# Patient Record
Sex: Female | Born: 1985 | Race: Black or African American | Hispanic: No | Marital: Single | Smoking: Never smoker
Health system: Southern US, Community
[De-identification: ages and names within clinical notes are randomized; demographics above are authoritative.]

## PROBLEM LIST (undated history)

## (undated) DIAGNOSIS — N83209 Unspecified ovarian cyst, unspecified side: Secondary | ICD-10-CM

---

## 2014-10-31 ENCOUNTER — Emergency Department (HOSPITAL_COMMUNITY)
Admission: EM | Admit: 2014-10-31 | Discharge: 2014-11-01 | Disposition: A | Discharge status: Home / Self Care | Payer: BLUE CROSS/BLUE SHIELD | Attending: Emergency Medicine | Admitting: Emergency Medicine

## 2014-10-31 ENCOUNTER — Encounter (HOSPITAL_COMMUNITY): Payer: Self-pay | Admitting: Emergency Medicine

## 2014-10-31 DIAGNOSIS — Z3202 Encounter for pregnancy test, result negative: Secondary | ICD-10-CM | POA: Insufficient documentation

## 2014-10-31 DIAGNOSIS — Z7982 Long term (current) use of aspirin: Secondary | ICD-10-CM | POA: Insufficient documentation

## 2014-10-31 DIAGNOSIS — R1032 Left lower quadrant pain: Secondary | ICD-10-CM | POA: Diagnosis not present

## 2014-10-31 DIAGNOSIS — Z8742 Personal history of other diseases of the female genital tract: Secondary | ICD-10-CM | POA: Insufficient documentation

## 2014-10-31 DIAGNOSIS — R1030 Lower abdominal pain, unspecified: Secondary | ICD-10-CM | POA: Diagnosis present

## 2014-10-31 HISTORY — DX: Unspecified ovarian cyst, unspecified side: N83.209

## 2014-10-31 LAB — COMPREHENSIVE METABOLIC PANEL
ALK PHOS: 69 U/L (ref 38–126)
ALT: 36 U/L (ref 14–54)
AST: 29 U/L (ref 15–41)
Albumin: 3.8 g/dL (ref 3.5–5.0)
Anion gap: 9 (ref 5–15)
BUN: 6 mg/dL (ref 6–20)
CALCIUM: 8.9 mg/dL (ref 8.9–10.3)
CHLORIDE: 105 mmol/L (ref 101–111)
CO2: 24 mmol/L (ref 22–32)
CREATININE: 0.87 mg/dL (ref 0.44–1.00)
GFR calc Af Amer: >60 mL/min (ref >60)
GFR calc non Af Amer: >60 mL/min (ref >60)
GLUCOSE: 93 mg/dL (ref 65–99)
Potassium: 3.8 mmol/L (ref 3.5–5.1)
SODIUM: 138 mmol/L (ref 135–145)
Total Bilirubin: 0.5 mg/dL (ref 0.3–1.2)
Total Protein: 7.4 g/dL (ref 6.5–8.1)

## 2014-10-31 LAB — CBC
HCT: 39.4 % (ref 36.0–46.0)
Hemoglobin: 13.7 g/dL (ref 12.0–15.0)
MCH: 30.6 pg (ref 26.0–34.0)
MCHC: 34.8 g/dL (ref 30.0–36.0)
MCV: 88.1 fL (ref 78.0–100.0)
PLATELETS: 324 10*3/uL (ref 150–400)
RBC: 4.47 MIL/uL (ref 3.87–5.11)
RDW: 13 % (ref 11.5–15.5)
WBC: 13.8 10*3/uL — ABNORMAL HIGH (ref 4.0–10.5)

## 2014-10-31 LAB — URINALYSIS, ROUTINE W REFLEX MICROSCOPIC
Bilirubin Urine: NEGATIVE
GLUCOSE, UA: NEGATIVE mg/dL
HGB URINE DIPSTICK: NEGATIVE
KETONES UR: NEGATIVE mg/dL
LEUKOCYTES UA: NEGATIVE
Nitrite: NEGATIVE
PH: 7 (ref 5.0–8.0)
Protein, ur: NEGATIVE mg/dL
Specific Gravity, Urine: 1.003 — ABNORMAL LOW (ref 1.005–1.030)
Urobilinogen, UA: 0.2 mg/dL (ref 0.0–1.0)

## 2014-10-31 LAB — LIPASE, BLOOD: LIPASE: 24 U/L (ref 22–51)

## 2014-10-31 LAB — POC URINE PREG, ED: Preg Test, Ur: NEGATIVE

## 2014-10-31 MED ORDER — SODIUM CHLORIDE 0.9 % IV BOLUS (SEPSIS)
1000.0000 mL | Freq: Once | INTRAVENOUS | Status: AC
  Administered 2014-11-01: 1000 mL via INTRAVENOUS

## 2014-10-31 MED ORDER — IOHEXOL 300 MG/ML  SOLN
25.0000 mL | Freq: Once | INTRAMUSCULAR | Status: AC | PRN
  Administered 2014-11-01: 25 mL via ORAL

## 2014-10-31 MED ORDER — METOCLOPRAMIDE HCL 5 MG/ML IJ SOLN
10.0000 mg | Freq: Once | INTRAMUSCULAR | Status: AC
  Administered 2014-11-01: 10 mg via INTRAVENOUS
  Filled 2014-10-31: qty 2

## 2014-10-31 MED ORDER — LIDOCAINE HCL 2 % EX GEL
1.0000 "application " | Freq: Once | CUTANEOUS | Status: AC
  Administered 2014-11-01: 1
  Filled 2014-10-31: qty 20

## 2014-10-31 MED ORDER — DIPHENHYDRAMINE HCL 50 MG/ML IJ SOLN
25.0000 mg | Freq: Once | INTRAMUSCULAR | Status: AC
  Administered 2014-11-01: 25 mg via INTRAVENOUS
  Filled 2014-10-31: qty 1

## 2014-10-31 NOTE — ED Notes (Signed)
Pt. reports generalized abdominal pain with nausea onset today , constipation for 2 weeks , last BM today with small/hard stools , denies emesis or diarrhea. No fever or chills.

## 2014-11-01 ENCOUNTER — Emergency Department (HOSPITAL_COMMUNITY): Payer: BLUE CROSS/BLUE SHIELD

## 2014-11-01 ENCOUNTER — Encounter (HOSPITAL_COMMUNITY): Payer: Self-pay | Admitting: Radiology

## 2014-11-01 MED ORDER — IOHEXOL 300 MG/ML  SOLN
100.0000 mL | Freq: Once | INTRAMUSCULAR | Status: AC | PRN
  Administered 2014-11-01: 100 mL via INTRAVENOUS

## 2014-11-01 NOTE — ED Provider Notes (Signed)
CSN: [id number]     Arrival date & time 10/31/14  2135 History   First MD Initiated Contact with Patient 10/31/14 2331     Chief Complaint  Patient presents with  . Abdominal Pain     The history is provided by the patient. No language interpreter was used.   Brittney Blake presents for evaluation of lower abdominal pain and constipation. She reports 2 weeks of constipation with decreased stool output and straining for bowel movements. She has pain and bleeding when she comes to have a bowel movement now. She tried multiple medications at home for constipation including laxatives, stool softeners, enemas. She denies any fevers but she does have diffuse lower abdominal pain that is greatest over the left lower quadrant. The pains been there for a few days and she has associated nausea. No vomiting, dysuria, vaginal discharge. She has a history of ovarian cyst and IUD placement.  No prior abdominal surgeries.  Past Medical History  Diagnosis Date  . Ovarian cyst    History reviewed. No pertinent past surgical history. No family history on file. Social History  Substance Use Topics  . Smoking status: Never Smoker   . Smokeless tobacco: None  . Alcohol Use: Yes   OB History    No data available     Review of Systems  All other systems reviewed and are negative.      Allergies  Review of patient's allergies indicates no known allergies.  Home Medications   Prior to Admission medications   Medication Sig Start Date End Date Taking? Authorizing Provider  aspirin-acetaminophen-caffeine (EXCEDRIN MIGRAINE) [phone] MG tablet Take 1 tablet by mouth every 6 (six) hours as needed for migraine.   Yes Historical Provider, MD   BP 106/53 mmHg  Pulse 77  Temp(Src) 98.1 F (36.7 C) (Oral)  Resp 20  SpO2 100%  LMP 10/22/2014 Physical Exam  Constitutional: She is oriented to person, place, and time. She appears well-developed and well-nourished.  HENT:  Head: Normocephalic and  atraumatic.  Cardiovascular: Normal rate and regular rhythm.   No murmur heard. Pulmonary/Chest: Effort normal and breath sounds normal. No respiratory distress.  Abdominal: Soft. There is no rebound and no guarding.  Mild left lower quadrant tenderness  Genitourinary:  No external hemorrhoids. The rectal tenderness on exam with no gross blood. No mass.  Musculoskeletal: She exhibits no edema or tenderness.  Neurological: She is alert and oriented to person, place, and time.  Skin: Skin is warm and dry.  Psychiatric: She has a normal mood and affect. Her behavior is normal.  Nursing note and vitals reviewed.   ED Course  Procedures (including critical care time) Labs Review Labs Reviewed  CBC - Abnormal; Notable for the following:    WBC 13.8 (*)    All other components within normal limits  URINALYSIS, ROUTINE W REFLEX MICROSCOPIC (NOT AT [facility]) - Abnormal; Notable for the following:    Specific Gravity, Urine 1.003 (*)    All other components within normal limits  LIPASE, BLOOD  COMPREHENSIVE METABOLIC PANEL  POC URINE PREG, ED    Imaging Review Ct Abdomen Pelvis W Contrast  11/01/2014   CLINICAL DATA:  Left lower quadrant pain with nausea.  Constipation.  EXAM: CT ABDOMEN AND PELVIS WITH CONTRAST  TECHNIQUE: Multidetector CT imaging of the abdomen and pelvis was performed using the standard protocol following bolus administration of intravenous contrast.  CONTRAST:  OMNIPAQUE IOHEXOL 300 MG/ML  SOLN  COMPARISON:  None.  FINDINGS: Lower  chest and abdominal wall:  No contributory findings.  Hepatobiliary: Amorphous hypervascular areas in the right liver, most discrete in segment 5-6 measuring 7 mm, favors incidental perfusion anomalies. In this patient with no chronic liver disease or malignancy history, benign hemangioma, FNH, or adenoma are the main differential considerations.No evidence of biliary obstruction or stone.  Pancreas: Unremarkable.  Spleen: Unremarkable.   Adrenals/Urinary Tract: Negative adrenals. No hydronephrosis or stone. Unremarkable bladder.  Reproductive:Negative uterus. Fairly symmetric ovarian volume. IUD in good position.  Stomach/Bowel: Distal colonic fluid levels which may reflect diarrhea. There is no inflammatory bowel wall thickening or obstruction. No appendicitis.  Vascular/Lymphatic: No acute vascular abnormality. No mass or adenopathy.  Peritoneal: No ascites or pneumoperitoneum.  Musculoskeletal: No acute abnormalities.  IMPRESSION: No acute finding.   Electronically Signed   By: Marnee Spring M.D.   On: 11/01/2014 01:10   I have personally reviewed and evaluated these images and lab results as part of my medical decision-making.   EKG Interpretation None      MDM   Final diagnoses:  Left lower quadrant pain    Patient here for evaluation of abdominal pain and constipation. No evidence of fecal impaction on examination. Patient did have abdominal tenderness in the left lower quadrant, the leukocytosis. CT negative for acute abnormality. There is no significant constipation on CT. Discussed with patient homecare for abdominal wall pain, fluid hydration, outpatient follow-up. Return precautions were discussed.  Brittney Fossa, MD 11/01/14 [phone]

## 2014-11-01 NOTE — Discharge Instructions (Signed)
Abdominal Pain, Women °Abdominal (stomach, pelvic, or belly) pain can be caused by many things. It is important to tell your doctor: °· The location of the pain. °· Does it come and go or is it present all the time? °· Are there things that start the pain (eating certain foods, exercise)? °· Are there other symptoms associated with the pain (fever, nausea, vomiting, diarrhea)? °All of this is helpful to know when trying to find the cause of the pain. °CAUSES  °· Stomach: virus or bacteria infection, or ulcer. °· Intestine: appendicitis (inflamed appendix), regional ileitis (Crohn's disease), ulcerative colitis (inflamed colon), irritable bowel syndrome, diverticulitis (inflamed diverticulum of the colon), or cancer of the stomach or intestine. °· Gallbladder disease or stones in the gallbladder. °· Kidney disease, kidney stones, or infection. °· Pancreas infection or cancer. °· Fibromyalgia (pain disorder). °· Diseases of the female organs: °¨ Uterus: fibroid (non-cancerous) tumors or infection. °¨ Fallopian tubes: infection or tubal pregnancy. °¨ Ovary: cysts or tumors. °¨ Pelvic adhesions (scar tissue). °¨ Endometriosis (uterus lining tissue growing in the pelvis and on the pelvic organs). °¨ Pelvic congestion syndrome (female organs filling up with blood just before the menstrual period). °¨ Pain with the menstrual period. °¨ Pain with ovulation (producing an egg). °¨ Pain with an IUD (intrauterine device, birth control) in the uterus. °¨ Cancer of the female organs. °· Functional pain (pain not caused by a disease, may improve without treatment). °· Psychological pain. °· Depression. °DIAGNOSIS  °Your doctor will decide the seriousness of your pain by doing an examination. °· Blood tests. °· X-rays. °· Ultrasound. °· CT scan (computed tomography, special type of X-ray). °· MRI (magnetic resonance imaging). °· Cultures, for infection. °· Barium enema (dye inserted in the large intestine, to better view it with  X-rays). °· Colonoscopy (looking in intestine with a lighted tube). °· Laparoscopy (minor surgery, looking in abdomen with a lighted tube). °· Major abdominal exploratory surgery (looking in abdomen with a large incision). °TREATMENT  °The treatment will depend on the cause of the pain.  °· Many cases can be observed and treated at home. °· Over-the-counter medicines recommended by your caregiver. °· Prescription medicine. °· Antibiotics, for infection. °· Birth control pills, for painful periods or for ovulation pain. °· Hormone treatment, for endometriosis. °· Nerve blocking injections. °· Physical therapy. °· Antidepressants. °· Counseling with a psychologist or psychiatrist. °· Minor or major surgery. °HOME CARE INSTRUCTIONS  °· Do not take laxatives, unless directed by your caregiver. °· Take over-the-counter pain medicine only if ordered by your caregiver. Do not take aspirin because it can cause an upset stomach or bleeding. °· Try a clear liquid diet (broth or water) as ordered by your caregiver. Slowly move to a bland diet, as tolerated, if the pain is related to the stomach or intestine. °· Have a thermometer and take your temperature several times a day, and record it. °· Bed rest and sleep, if it helps the pain. °· Avoid sexual intercourse, if it causes pain. °· Avoid stressful situations. °· Keep your follow-up appointments and tests, as your caregiver orders. °· If the pain does not go away with medicine or surgery, you may try: °¨ Acupuncture. °¨ Relaxation exercises (yoga, meditation). °¨ Group therapy. °¨ Counseling. °SEEK MEDICAL CARE IF:  °· You notice certain foods cause stomach pain. °· Your home care treatment is not helping your pain. °· You need stronger pain medicine. °· You want your IUD removed. °· You feel faint or   lightheaded. °· You develop nausea and vomiting. °· You develop a rash. °· You are having side effects or an allergy to your medicine. °SEEK IMMEDIATE MEDICAL CARE IF:  °· Your  pain does not go away or gets worse. °· You have a fever. °· Your pain is felt only in portions of the abdomen. The right side could possibly be appendicitis. The left lower portion of the abdomen could be colitis or diverticulitis. °· You are passing blood in your stools (bright red or black tarry stools, with or without vomiting). °· You have blood in your urine. °· You develop chills, with or without a fever. °· You pass out. °MAKE SURE YOU:  °· Understand these instructions. °· Will watch your condition. °· Will get help right away if you are not doing well or get worse. °Document Released: 11/14/2006 Document Revised: 06/03/2013 Document Reviewed: 12/04/2008 °[employer]® Patient Information ©2015 [employer]. This information is not intended to replace advice given to you by your health care provider. Make sure you discuss any questions you have with your health care provider. ° °

## 2017-02-10 IMAGING — CT CT ABD-PELV W/ CM
2 of 4 series · 17 of 46 positions shown, 19 images · IV contrast (APPLIED)
Comparison: None.

CLINICAL DATA: Left lower quadrant pain with nausea.  Constipation.

EXAM:
CT ABDOMEN AND PELVIS WITH CONTRAST
TECHNIQUE: Multidetector CT imaging of the abdomen and pelvis was performed
using the standard protocol following bolus administration of
intravenous contrast.
CONTRAST:  100mL OMNIPAQUE IOHEXOL 300 MG/ML  SOLN

[Series 2: abd/ pelvis 5.0 i30f 1 · axial · 0.91mm/px · z∈[+772,+1262]mm · 14 of 108 slices shown, 16 images]
[im 5/108  soft-tissue]
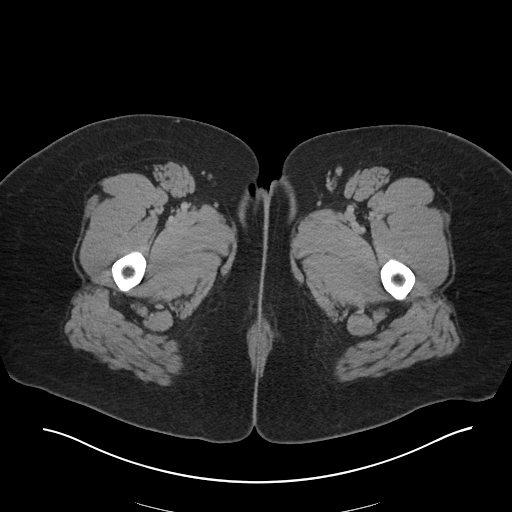
[im 5/108  bone]
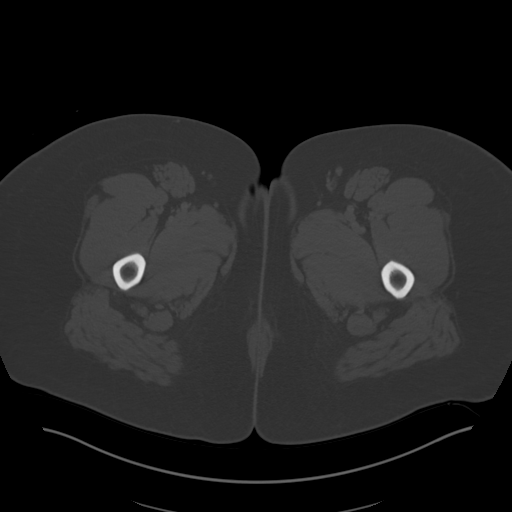
[im 13/108  soft-tissue]
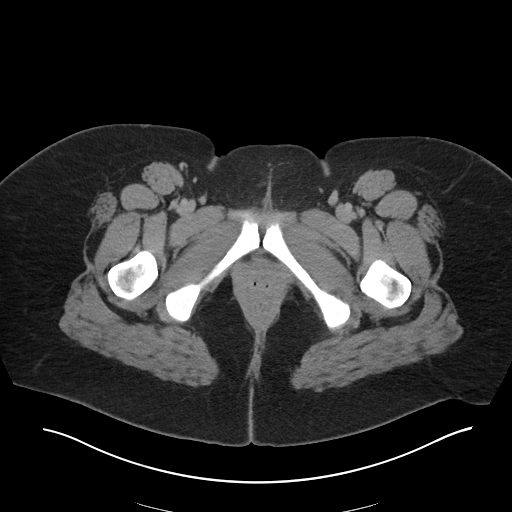
[im 22/108  soft-tissue]
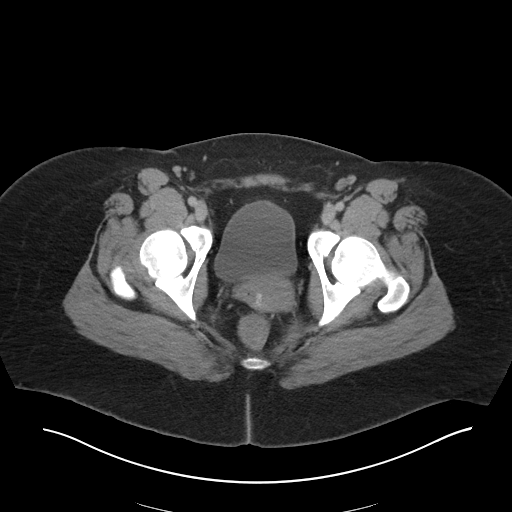
[im 30/108  soft-tissue]
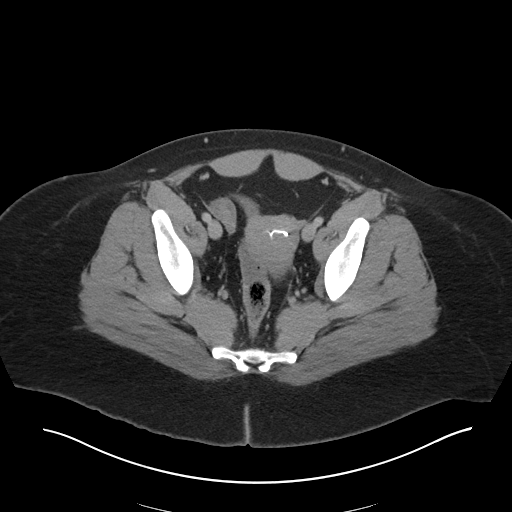
[im 35/108  soft-tissue]
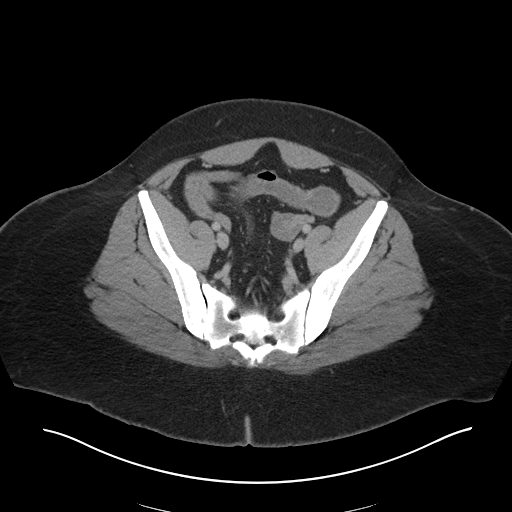
[im 43/108  soft-tissue]
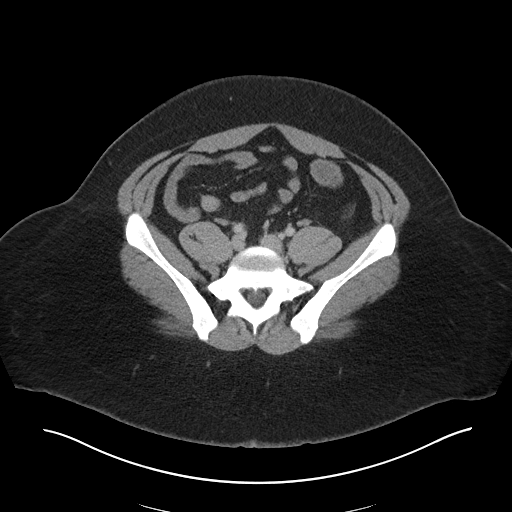
[im 52/108  soft-tissue]
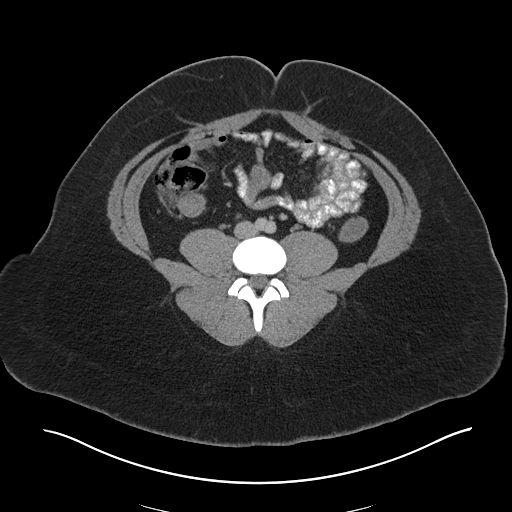
[im 56/108  soft-tissue]
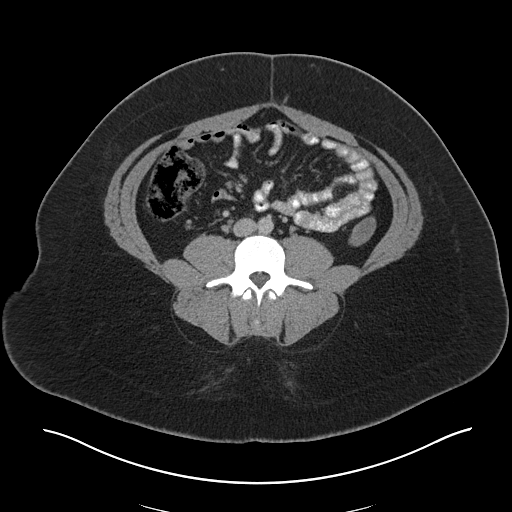
[im 65/108  soft-tissue]
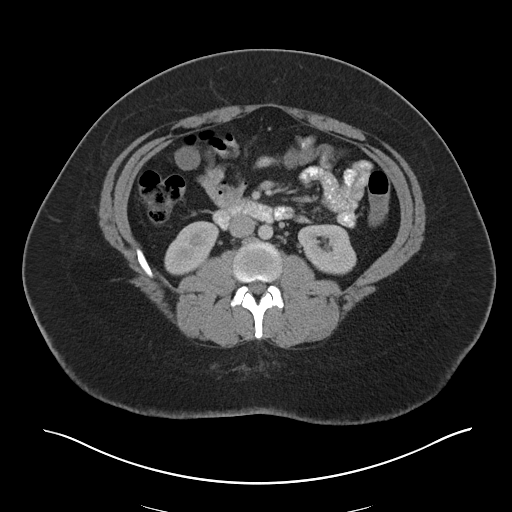
[im 65/108  bone]
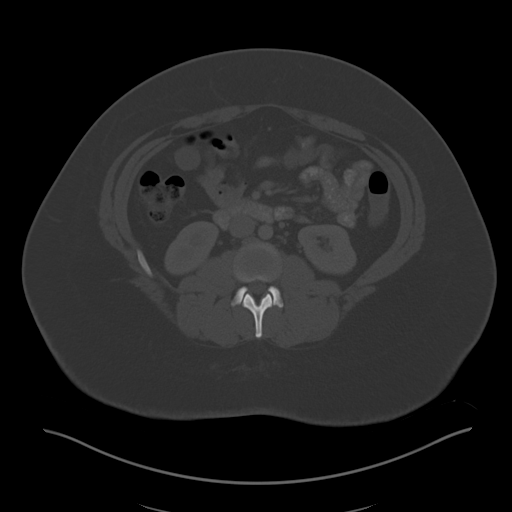
[im 73/108  soft-tissue]
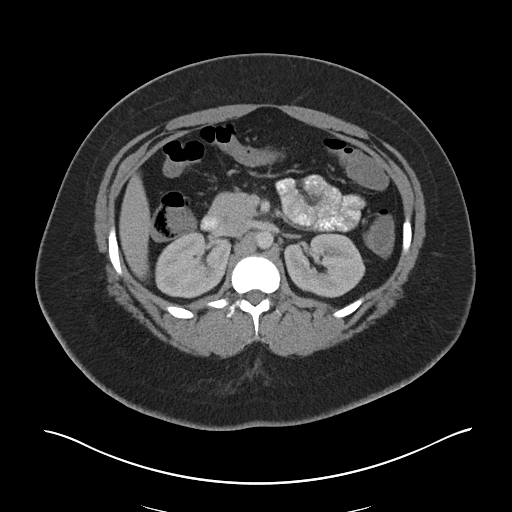
[im 82/108  soft-tissue]
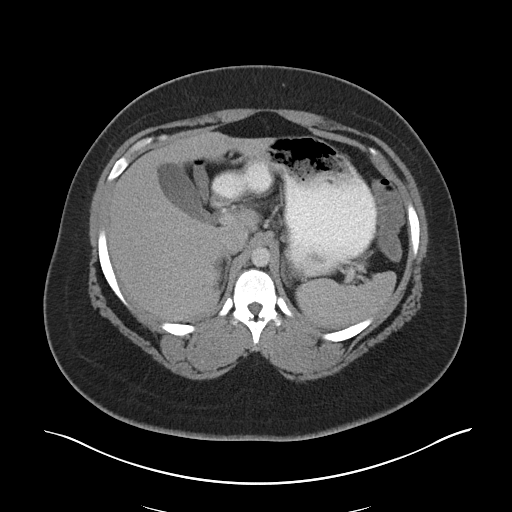
[im 86/108  soft-tissue]
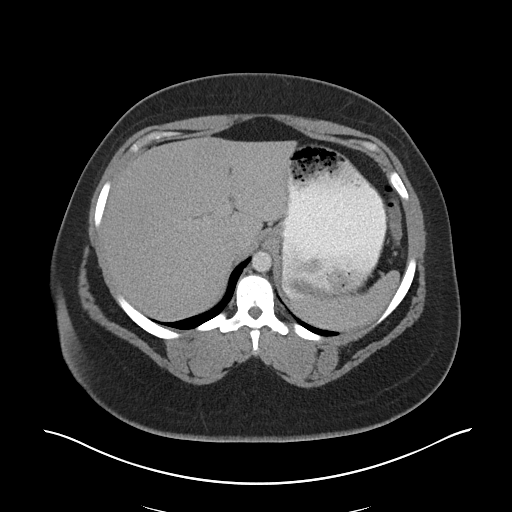
[im 95/108  soft-tissue]
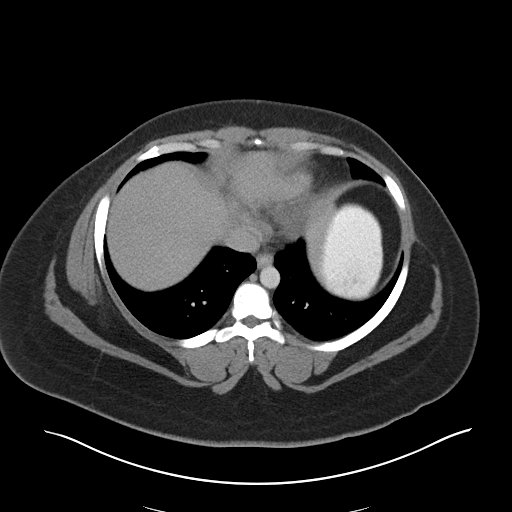
[im 103/108  soft-tissue]
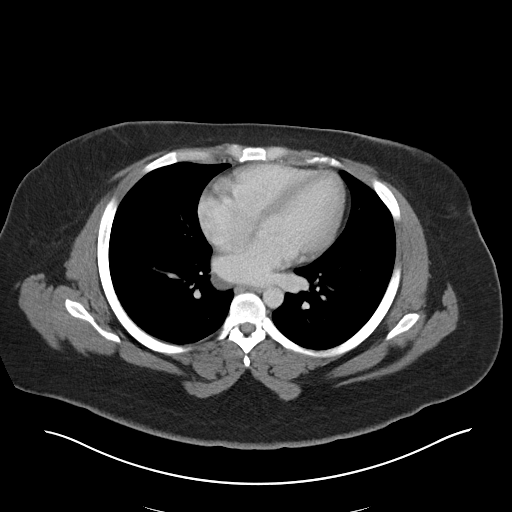

[Series 5: coronal soft tissue · coronal · 0.97mm/px · 3 of 123 slices shown]
[im 41/123  soft-tissue]
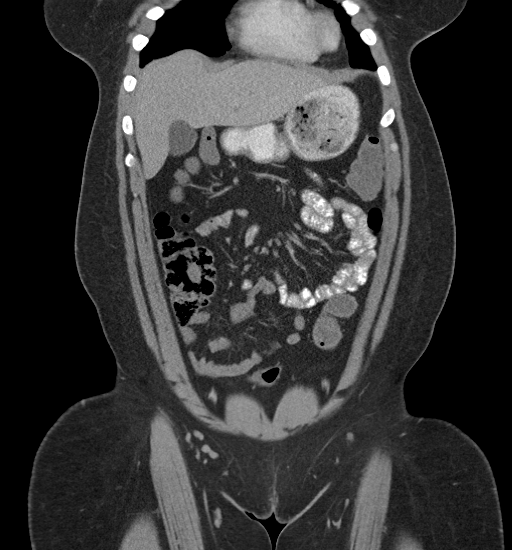
[im 55/123  soft-tissue]
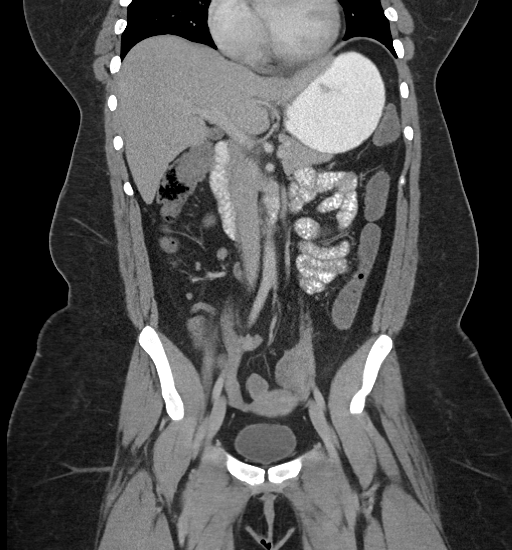
[im 68/123  soft-tissue]
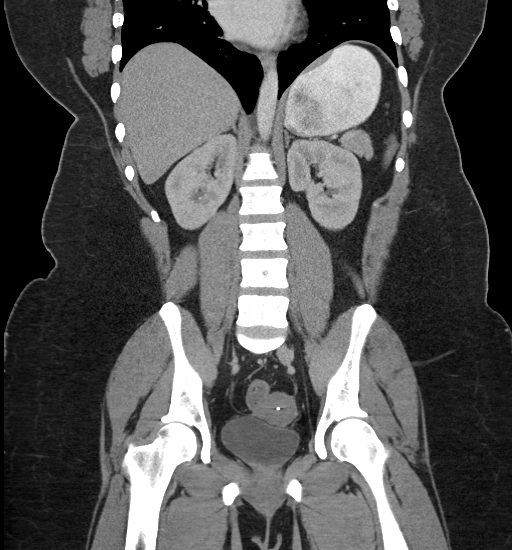

[17 of 46 positions shown; findings below may reference images not displayed]

FINDINGS: Lower chest and abdominal wall:  No contributory findings.

Hepatobiliary: Amorphous hypervascular areas in the right liver,
most discrete in segment 5-6 measuring 7 mm, favors incidental
perfusion anomalies. In this patient with no chronic liver disease
or malignancy history, benign hemangioma, FNH, or adenoma are the
main differential considerations.No evidence of biliary obstruction
or stone.

Pancreas: Unremarkable.

Spleen: Unremarkable.

Adrenals/Urinary Tract: Negative adrenals. No hydronephrosis or
stone. Unremarkable bladder.

Reproductive:Negative uterus. Fairly symmetric ovarian volume. IUD
in good position.

Stomach/Bowel: Distal colonic fluid levels which may reflect
diarrhea. There is no inflammatory bowel wall thickening or
obstruction. No appendicitis.

Vascular/Lymphatic: No acute vascular abnormality. No mass or
adenopathy.

Peritoneal: No ascites or pneumoperitoneum.

Musculoskeletal: No acute abnormalities.
IMPRESSION: No acute finding.
